# Patient Record
Sex: Female | Born: 1975 | Race: White | Hispanic: No | Marital: Married | Smoking: Never smoker
Health system: Southern US, Community
[De-identification: ages and names within clinical notes are randomized; demographics above are authoritative.]

## PROBLEM LIST (undated history)

## (undated) DIAGNOSIS — F3181 Bipolar II disorder: Secondary | ICD-10-CM

## (undated) HISTORY — DX: Bipolar II disorder: F31.81

---

## 2019-03-30 ENCOUNTER — Ambulatory Visit (INDEPENDENT_AMBULATORY_CARE_PROVIDER_SITE_OTHER): Payer: 59 | Admitting: Otolaryngology

## 2019-03-30 DIAGNOSIS — H903 Sensorineural hearing loss, bilateral: Secondary | ICD-10-CM

## 2019-03-30 DIAGNOSIS — H9209 Otalgia, unspecified ear: Secondary | ICD-10-CM

## 2019-05-24 DIAGNOSIS — G4730 Sleep apnea, unspecified: Secondary | ICD-10-CM | POA: Insufficient documentation

## 2019-11-28 ENCOUNTER — Telehealth: Payer: Self-pay | Admitting: Orthopaedic Surgery

## 2019-11-28 NOTE — Telephone Encounter (Signed)
Call received via voice message; called back to patient; received voice mail; left message.

## 2019-11-29 NOTE — Telephone Encounter (Signed)
Patient called back in response and scheduled appointment.

## 2019-12-07 ENCOUNTER — Ambulatory Visit: Payer: PRIVATE HEALTH INSURANCE

## 2019-12-07 ENCOUNTER — Encounter: Payer: Self-pay | Admitting: Orthopaedic Surgery

## 2019-12-07 ENCOUNTER — Ambulatory Visit: Payer: 59 | Admitting: Orthopaedic Surgery

## 2019-12-07 ENCOUNTER — Other Ambulatory Visit: Payer: Self-pay

## 2019-12-07 ENCOUNTER — Telehealth: Payer: Self-pay | Admitting: Radiology

## 2019-12-07 VITALS — BP 147/89 | HR 77 | Ht 60.0 in | Wt 204.0 lb

## 2019-12-07 DIAGNOSIS — M79672 Pain in left foot: Secondary | ICD-10-CM | POA: Diagnosis not present

## 2019-12-07 DIAGNOSIS — G8929 Other chronic pain: Secondary | ICD-10-CM

## 2019-12-07 NOTE — Progress Notes (Signed)
Subjective:    Patient ID: Beth Bradley, female    DOB: August 30, 1975, 44 y.o.   MRN: [id number]  HPI She has had pain of the left foot and swelling for over two weeks. She denies any trauma. She had stress fracture of the left foot several years ago and she is concerned that may have happened again.  She has elevated the foot and used her CAM walker she had some.  She has no definitive trauma. She has no redness. She has no other joint problem.  I told her that she could have a new stress fracture but they often do not show up on X-rays and show up weeks later.  I will not order a scan today but that is a possibility.  I will also get a bone density study.  She does not drink milk or take supplemental calcium.     Review of Systems  Constitutional: Positive for activity change.  Musculoskeletal: Positive for arthralgias, gait problem and joint swelling.  All other systems reviewed and are negative.  For Review of Systems, all other systems reviewed and are negative.  The following is a summary of the past history medically, past history surgically, known current medicines, social history and family history.  This information is gathered electronically by the computer from prior information and documentation.  I review this each visit and have found including this information at this point in the chart is beneficial and informative.   Past Medical History:  Diagnosis Date  . Bipolar 2 disorder [facility])     Past Surgical History:  Procedure Laterality Date  . CESAREAN SECTION     x3    Current Outpatient Medications on File Prior to Visit  Medication Sig Dispense Refill  . buPROPion (WELLBUTRIN XL) 300 MG 24 hr tablet     . Lurasidone HCl (LATUDA) 60 MG TABS     . methylphenidate 18 MG PO CR tablet     . vortioxetine HBr (TRINTELLIX) 20 MG TABS tablet Take by mouth.     No current facility-administered medications on file prior to visit.    Social History   Socioeconomic  History  . Marital status: Married    Spouse name: Not on file  . Number of children: Not on file  . Years of education: Not on file  . Highest education level: Not on file  Occupational History  . Not on file  Tobacco Use  . Smoking status: Never Smoker  . Smokeless tobacco: Never Used  Substance and Sexual Activity  . Alcohol use: Not on file  . Drug use: Not on file  . Sexual activity: Not on file  Other Topics Concern  . Not on file  Social History Narrative  . Not on file   Social Determinants of Health   Financial Resource Strain:   . Difficulty of Paying Living Expenses:   Food Insecurity:   . Worried About Charity fundraiser in the Last Year:   . Arboriculturist in the Last Year:   Transportation Needs:   . Film/video editor (Medical):   Marland Kitchen Lack of Transportation (Non-Medical):   Physical Activity:   . Days of Exercise per Week:   . Minutes of Exercise per Session:   Stress:   . Feeling of Stress :   Social Connections:   . Frequency of Communication with Friends and Family:   . Frequency of Social Gatherings with Friends and Family:   . Attends Religious Services:   .  Active Member of Clubs or Organizations:   . Attends Archivist Meetings:   Marland Kitchen Marital Status:   Intimate Partner Violence:   . Fear of Current or Ex-Partner:   . Emotionally Abused:   Marland Kitchen Physically Abused:   . Sexually Abused:     Family History  Problem Relation Age of Onset  . Cancer Father   . Hypertension Father   . Diabetes Other     BP (!) 147/89   Pulse 77   Ht 5' (1.524 m)   Wt 204 lb (92.5 kg)   LMP 12/07/2019 (Exact Date)   BMI 39.84 kg/m   Body mass index is 39.84 kg/m.     Objective:   Physical Exam Vitals and nursing note reviewed.  Constitutional:      Appearance: She is well-developed.  HENT:     Head: Normocephalic and atraumatic.  Eyes:     Conjunctiva/sclera: Conjunctivae normal.     Pupils: Pupils are equal, round, and reactive to  light.  Cardiovascular:     Rate and Rhythm: Normal rate and regular rhythm.  Pulmonary:     Effort: Pulmonary effort is normal.  Abdominal:     Palpations: Abdomen is soft.  Musculoskeletal:     Cervical back: Normal range of motion and neck supple.       Feet:  Skin:    General: Skin is warm and dry.  Neurological:     Mental Status: She is alert and oriented to person, place, and time.     Cranial Nerves: No cranial nerve deficit.     Motor: No abnormal muscle tone.     Coordination: Coordination normal.     Deep Tendon Reflexes: Reflexes are normal and symmetric. Reflexes normal.  Psychiatric:        Behavior: Behavior normal.        Thought Content: Thought content normal.        Judgment: Judgment normal.   X-rays were done of the left foot, reported separately.        Assessment & Plan:   Encounter Diagnosis  Name Primary?  . Chronic foot pain, left Yes   I will get bone density study.  Elevate and use ice.  Use the CAM walker as needed.  Consider bone scan if pain continues next visit.  Call if any problem.  Precautions discussed.   Electronically Signed Sanjuana Kava, MD 5/20/202111:51 AM

## 2019-12-07 NOTE — Telephone Encounter (Signed)
DR Luna Glasgow had me order a bone density of patients foot, she has history of stress fracture  Bone density orders do not send to the work que  Can you call to schedule her at [facility]?  [phone]

## 2019-12-21 ENCOUNTER — Ambulatory Visit: Payer: PRIVATE HEALTH INSURANCE | Admitting: Orthopaedic Surgery

## 2020-01-01 ENCOUNTER — Ambulatory Visit (HOSPITAL_COMMUNITY)
Admission: RE | Admit: 2020-01-01 | Discharge: 2020-01-01 | Disposition: A | Discharge status: Home / Self Care | Payer: 59 | Source: Ambulatory Visit | Attending: Orthopaedic Surgery | Admitting: Orthopaedic Surgery

## 2020-01-01 ENCOUNTER — Other Ambulatory Visit: Payer: Self-pay

## 2020-01-01 DIAGNOSIS — G8929 Other chronic pain: Secondary | ICD-10-CM | POA: Insufficient documentation

## 2020-01-01 DIAGNOSIS — M79672 Pain in left foot: Secondary | ICD-10-CM | POA: Insufficient documentation

## 2020-01-04 ENCOUNTER — Ambulatory Visit: Payer: 59

## 2020-01-04 ENCOUNTER — Encounter: Payer: Self-pay | Admitting: Orthopaedic Surgery

## 2020-01-04 ENCOUNTER — Other Ambulatory Visit: Payer: Self-pay

## 2020-01-04 ENCOUNTER — Ambulatory Visit (INDEPENDENT_AMBULATORY_CARE_PROVIDER_SITE_OTHER): Payer: 59 | Admitting: Orthopaedic Surgery

## 2020-01-04 VITALS — BP 124/89 | HR 78 | Ht 60.0 in | Wt 204.0 lb

## 2020-01-04 DIAGNOSIS — M79672 Pain in left foot: Secondary | ICD-10-CM | POA: Diagnosis not present

## 2020-01-04 DIAGNOSIS — G8929 Other chronic pain: Secondary | ICD-10-CM

## 2020-01-04 NOTE — Progress Notes (Signed)
Patient Beth Bradley, female DOB:29-Oct-1975, 44 y.o. WOE:[id number]  Chief Complaint  Patient presents with  . Foot Pain    left still painful     HPI  Beth Bradley is a 44 y.o. female who has left foot pain.  She went to the [facility] earlier in the week and walked too much. She has had more pain in the left foot. She is using the CAM walker.  She has had prior stress fracture.  She got the bone density done and it showed low normal.  I have told her about Vitamin D and calcium.    I will have her use the CAM walker.  I could get a scan but it will not change treatment.  Return in three weeks and get x-rays then.   Body mass index is 39.84 kg/m.  ROS  Review of Systems  Constitutional: Positive for activity change.  Musculoskeletal: Positive for arthralgias, gait problem and joint swelling.  All other systems reviewed and are negative.   All other systems reviewed and are negative.  The following is a summary of the past history medically, past history surgically, known current medicines, social history and family history.  This information is gathered electronically by the computer from prior information and documentation.  I review this each visit and have found including this information at this point in the chart is beneficial and informative.    Past Medical History:  Diagnosis Date  . Bipolar 2 disorder [facility])     Past Surgical History:  Procedure Laterality Date  . CESAREAN SECTION     x3    Family History  Problem Relation Age of Onset  . Cancer Father   . Hypertension Father   . Diabetes Other     Social History Social History   Tobacco Use  . Smoking status: Never Smoker  . Smokeless tobacco: Never Used  Substance Use Topics  . Alcohol use: Not on file  . Drug use: Not on file    No Known Allergies  Current Outpatient Medications  Medication Sig Dispense Refill  . buPROPion (WELLBUTRIN XL) 300 MG 24 hr tablet     . Lurasidone HCl  (LATUDA) 60 MG TABS     . methylphenidate 18 MG PO CR tablet     . vortioxetine HBr (TRINTELLIX) 20 MG TABS tablet Take by mouth.     No current facility-administered medications for this visit.     Physical Exam  Blood pressure 124/89, pulse 78, height 5' (1.524 m), weight 204 lb (92.5 kg), last menstrual period 12/07/2019.  Constitutional: overall normal hygiene, normal nutrition, well developed, normal grooming, normal body habitus. Assistive device:CAM walker  Musculoskeletal: gait and station Limp left, muscle tone and strength are normal, no tremors or atrophy is present.  .  Neurological: coordination overall normal.  Deep tendon reflex/nerve stretch intact.  Sensation normal.  Cranial nerves II-XII intact.   Skin:   Normal overall no scars, lesions, ulcers or rashes. No psoriasis.  Psychiatric: Alert and oriented x 3.  Recent memory intact, remote memory unclear.  Normal mood and affect. Well groomed.  Good eye contact.  Cardiovascular: overall no swelling, no varicosities, no edema bilaterally, normal temperatures of the legs and arms, no clubbing, cyanosis and good capillary refill.  Lymphatic: palpation is normal.  Left foot tender dorsally but no swelling or redness.  Limp left.  NV intact.  All other systems reviewed and are negative   The patient has been educated about the nature  of the problem(s) and counseled on treatment options.  The patient appeared to understand what I have discussed and is in agreement with it.  Encounter Diagnosis  Name Primary?  . Chronic foot pain, left Yes   X-rays were done of the left foot, reported separately.  PLAN Call if any problems.  Precautions discussed.  Continue current medications.   Return to clinic 3 weeks   Continue the CAM walker.  Electronically Signed Sanjuana Kava, MD 6/17/202111:15 AM

## 2020-01-05 ENCOUNTER — Telehealth: Payer: Self-pay

## 2020-01-05 NOTE — Telephone Encounter (Signed)
Patient called stating that you would send her something in for pain to [facility]. States that they never received anything from our office for her. She is asking if you will prescribe her something for pain.  PATIENT USES [facility]

## 2020-01-08 MED ORDER — HYDROCODONE-ACETAMINOPHEN 5-325 MG PO TABS: ORAL_TABLET | ORAL | 0 refills | Status: AC

## 2020-01-30 ENCOUNTER — Ambulatory Visit: Payer: 59 | Admitting: Orthopaedic Surgery
